# Patient Record
Sex: Male | Born: 1980 | Race: White | Hispanic: Yes | Marital: Married | Smoking: Former smoker
Health system: Southern US, Community
[De-identification: ages and names within clinical notes are randomized; demographics above are authoritative.]

## PROBLEM LIST (undated history)

## (undated) DIAGNOSIS — F419 Anxiety disorder, unspecified: Secondary | ICD-10-CM

## (undated) HISTORY — DX: Anxiety disorder, unspecified: F41.9

---

## 2004-12-08 ENCOUNTER — Emergency Department (HOSPITAL_COMMUNITY): Admission: EM | Admit: 2004-12-08 | Discharge: 2004-12-08 | Payer: Self-pay | Admitting: Family Medicine

## 2016-05-09 ENCOUNTER — Ambulatory Visit (INDEPENDENT_AMBULATORY_CARE_PROVIDER_SITE_OTHER): Payer: Self-pay | Admitting: Physician Assistant

## 2016-05-09 VITALS — BP 123/72 | HR 70 | Resp 16 | Wt 206.0 lb

## 2016-05-09 DIAGNOSIS — N410 Acute prostatitis: Secondary | ICD-10-CM

## 2016-05-09 DIAGNOSIS — R1011 Right upper quadrant pain: Secondary | ICD-10-CM

## 2016-05-09 DIAGNOSIS — R350 Frequency of micturition: Secondary | ICD-10-CM

## 2016-05-09 LAB — POCT CBC
GRANULOCYTE PERCENT: 72 % (ref 37–80)
HEMATOCRIT: 43.1 % — AB (ref 43.5–53.7)
HEMOGLOBIN: 15.3 g/dL (ref 14.1–18.1)
LYMPH, POC: 1.4 (ref 0.6–3.4)
MCH, POC: 31.4 pg — AB (ref 27–31.2)
MCHC: 35.5 g/dL — AB (ref 31.8–35.4)
MCV: 88.4 fL (ref 80–97)
MID (cbc): 0.4 (ref 0–0.9)
MPV: 8.3 fL (ref 0–99.8)
POC GRANULOCYTE: 4.5 (ref 2–6.9)
POC LYMPH PERCENT: 22.4 %L (ref 10–50)
POC MID %: 5.6 %M (ref 0–12)
Platelet Count, POC: 148 10*3/uL (ref 142–424)
RBC: 4.88 M/uL (ref 4.69–6.13)
RDW, POC: 13.4 %
WBC: 6.3 10*3/uL (ref 4.6–10.2)

## 2016-05-09 LAB — POC MICROSCOPIC URINALYSIS (UMFC): Mucus: ABSENT

## 2016-05-09 LAB — POCT URINALYSIS DIP (MANUAL ENTRY)
BILIRUBIN UA: NEGATIVE
Bilirubin, UA: NEGATIVE
Glucose, UA: NEGATIVE
Leukocytes, UA: NEGATIVE
Nitrite, UA: NEGATIVE
PH UA: 5.5
PROTEIN UA: NEGATIVE
Spec Grav, UA: >=1.03
Urobilinogen, UA: 0.2

## 2016-05-09 MED ORDER — CIPROFLOXACIN HCL 500 MG PO TABS: 500.0000 mg | ORAL_TABLET | Freq: Two times a day (BID) | ORAL | 0 refills | Status: AC

## 2016-05-09 NOTE — Progress Notes (Signed)
05/09/2016 at 1:46 PM  David Bell / DOB: May 01, 1981 / MRN: [id number]  The patient  does not have a problem list on file.  SUBJECTIVE  David Bell is a 36 y.o. male who complains of dysuria, hematuria, urinary frequency, urinary hesitancy, and suprapubic abdominal pain x 5  days. He denies flank pain, pelvic pain,rectal pain, penile discharge, and testicular swelling or pain. Has tried cranberry juice with no relief. Most recent UTI was eight months ago but he cannot recall what he was treated with. Has no history of kidney stones. He drinks 0.5 gallon a day. Denies history of smoking or recent upper respiratory illness.  Has history of gallbladder sludge x 1 year ago per RUQ [place].    He  has no past medical history on file.    Medications reviewed and updated by myself where necessary, and exist elsewhere in the encounter.   Mr. David Bell has no allergies on file. He  reports that he has never smoked. He has never used smokeless tobacco. He reports that he drinks alcohol. He reports that he does not use drugs. He  has no sexual activity history on file. The patient  has no past surgical history on file.  His family history is not on file.  Review of Systems  Constitutional: Negative for chills, diaphoresis, fever and malaise/fatigue.  HENT: Negative for congestion and sore throat.   Gastrointestinal: Negative for blood in stool, constipation and diarrhea.  Musculoskeletal: Negative for joint pain and myalgias.    OBJECTIVE  His  weight is 206 lb (93.4 kg). His blood pressure is 123/72 and his pulse is 70. His respiration is 16 and oxygen saturation is 99%.  The patient's body mass index is unknown because there is no height or weight on file.  Physical Exam  Constitutional: He is oriented to person, place, and time. He appears well-developed and well-nourished. No distress.  HENT:  Head: Normocephalic and atraumatic.  Eyes: Conjunctivae are normal.  Neck: Normal range of motion.    Respiratory: Effort normal.  GI: Soft. Normal appearance and bowel sounds are normal. There is tenderness in the right upper quadrant and suprapubic area. There is positive Murphy's sign. There is no CVA tenderness.  Genitourinary: Prostate is not enlarged and not tender.  Genitourinary Comments: Prostate exam did elicit a sudden urge for pt to urinate.  Musculoskeletal: Normal range of motion.  Neurological: He is alert and oriented to person, place, and time.  Skin: Skin is warm and dry.  Psychiatric: He has a normal mood and affect.    Results for orders placed or performed in visit on 05/09/16 (from the past 24 hour(s))  POCT urinalysis dipstick     Status: Abnormal   Collection Time: 05/09/16 11:45 AM  Result Value Ref Range   Color, UA yellow yellow   Clarity, UA clear clear   Glucose, UA negative negative   Bilirubin, UA negative negative   Ketones, POC UA negative negative   Spec Grav, UA >=1.030    Blood, UA trace-lysed (A) negative   pH, UA 5.5    Protein Ur, POC negative negative   Urobilinogen, UA 0.2    Nitrite, UA Negative Negative   Leukocytes, UA Negative Negative  POCT Microscopic Urinalysis ([facility])     Status: Abnormal   Collection Time: 05/09/16 12:03 PM  Result Value Ref Range   WBC,UR,HPF,POC None None WBC/hpf   RBC,UR,HPF,POC None None RBC/hpf   Bacteria None None, Too numerous to count  Mucus Absent Absent   Epithelial Cells, UR Per Microscopy Few (A) None, Too numerous to count cells/hpf  POCT CBC     Status: Abnormal   Collection Time: 05/09/16 12:42 PM  Result Value Ref Range   WBC 6.3 4.6 - 10.2 K/uL   Lymph, poc 1.4 0.6 - 3.4   POC LYMPH PERCENT 22.4 10 - 50 %L   MID (cbc) 0.4 0 - 0.9   POC MID % 5.6 0 - 12 %M   POC Granulocyte 4.5 2 - 6.9   Granulocyte percent 72.0 37 - 80 %G   RBC 4.88 4.69 - 6.13 M/uL   Hemoglobin 15.3 14.1 - 18.1 g/dL   HCT, POC 43.1 (A) 43.5 - 53.7 %   MCV 88.4 80 - 97 fL   MCH, POC 31.4 (A) 27 - 31.2 pg   MCHC 35.5  (A) 31.8 - 35.4 g/dL   RDW, POC 13.4 %   Platelet Count, POC 148 142 - 424 K/uL   MPV 8.3 0 - 99.8 fL   ASSESSMENT & PLAN  This case was precepted with Dr. Linna Darner.   Elier was seen today for flank pain, hematuria and dysuria.  Diagnoses and all orders for this visit:  Urinary frequency - Await results      -  POCT urinalysis dipstick -     POCT Microscopic Urinalysis ([facility]) -     POCT CBC -     CMP14+EGFR -     PSA -     GC/Chlamydia Probe Amp -     Urine culture  Prostatitis, acute -     ciprofloxacin (CIPRO) 500 MG tablet; Take 1 tablet (500 mg total) by mouth 2 (two) times daily. - Pt instructed to follow up if no improvement in 2 weeks.   RUQ pain -     US Abdomen Limited RUQ; Future   The patient was advised to call or come back to clinic if he does not see an improvement in symptoms, or worsens with the above plan.   Tenna Delaine, PA-C [facility] 05/09/2016 1:46 PM

## 2016-05-09 NOTE — Patient Instructions (Addendum)
We will treat this as prostatitis. Take the medication twice daily for 14 days. If symptoms continue, please return to clinic. If anything worsens, seek care sooner. We will contact you with your other lab results. Thank you for letting me participate in your health and well being.   Prostatitis Prostatitis is swelling of the prostate gland. The prostate helps to make semen. It is below a man's bladder, in front of the rectum. There are different types of prostatitis. Follow these instructions at home:  Take over-the-counter and prescription medicines only as told by your doctor.  If you were prescribed an antibiotic medicine, take it as told by your doctor. Do not stop taking the antibiotic even if you start to feel better.  If your doctor prescribed exercises, do them as directed.  Take sitz baths as told by your doctor. To take a sitz bath, sit in warm water that is deep enough to cover your hips and butt.  Keep all follow-up visits as told by your doctor. This is important. Contact a doctor if:  Your symptoms get worse.  You have a fever. Get help right away if:  You have chills.  You feel sick to your stomach (nauseous).  You throw up (vomit).  You feel light-headed.  You feel like you might pass out (faint).  You cannot pee (urinate).  You have blood or clumps of blood (blood clots) in your pee (urine). This information is not intended to replace advice given to you by your health care provider. Make sure you discuss any questions you have with your health care provider. Document Released: 10/24/2011 Document Revised: 01/13/2016 Document Reviewed: 01/13/2016 Elsevier Interactive Patient Education  2017 [employer]

## 2016-05-10 LAB — CMP14+EGFR
ALK PHOS: 88 IU/L (ref 39–117)
ALT: 35 IU/L (ref 0–44)
AST: 19 IU/L (ref 0–40)
Albumin/Globulin Ratio: 1.7 (ref 1.2–2.2)
Albumin: 4.3 g/dL (ref 3.5–5.5)
BILIRUBIN TOTAL: 0.3 mg/dL (ref 0.0–1.2)
BUN/Creatinine Ratio: 19 (ref 9–20)
BUN: 20 mg/dL (ref 6–20)
CO2: 26 mmol/L (ref 18–29)
Calcium: 9.3 mg/dL (ref 8.7–10.2)
Chloride: 99 mmol/L (ref 96–106)
Creatinine, Ser: 1.03 mg/dL (ref 0.76–1.27)
GFR calc non Af Amer: 93 mL/min/{1.73_m2} (ref >59)
GFR, EST AFRICAN AMERICAN: 108 mL/min/{1.73_m2} (ref >59)
GLUCOSE: 81 mg/dL (ref 65–99)
Globulin, Total: 2.6 g/dL (ref 1.5–4.5)
POTASSIUM: 4.4 mmol/L (ref 3.5–5.2)
Sodium: 140 mmol/L (ref 134–144)
TOTAL PROTEIN: 6.9 g/dL (ref 6.0–8.5)

## 2016-05-10 LAB — PSA: PROSTATE SPECIFIC AG, SERUM: 0.9 ng/mL (ref 0.0–4.0)

## 2016-05-11 LAB — GC/CHLAMYDIA PROBE AMP
CHLAMYDIA, DNA PROBE: NEGATIVE
Neisseria gonorrhoeae by PCR: NEGATIVE

## 2016-05-11 LAB — URINE CULTURE: ORGANISM ID, BACTERIA: NO GROWTH

## 2016-05-15 ENCOUNTER — Ambulatory Visit
Admission: RE | Admit: 2016-05-15 | Discharge: 2016-05-15 | Disposition: A | Discharge status: Home / Self Care | Payer: No Typology Code available for payment source | Source: Ambulatory Visit | Attending: Physician Assistant | Admitting: Physician Assistant

## 2016-05-15 DIAGNOSIS — R1011 Right upper quadrant pain: Secondary | ICD-10-CM

## 2018-10-10 ENCOUNTER — Encounter: Payer: Self-pay | Admitting: Medical

## 2018-10-10 ENCOUNTER — Telehealth: Payer: Self-pay | Admitting: Medical

## 2018-10-10 ENCOUNTER — Ambulatory Visit (INDEPENDENT_AMBULATORY_CARE_PROVIDER_SITE_OTHER): Payer: BC Managed Care – PPO | Admitting: Medical

## 2018-10-10 ENCOUNTER — Other Ambulatory Visit: Payer: Self-pay

## 2018-10-10 VITALS — BP 130/79 | HR 57 | Temp 98.3°F | Resp 16 | Ht 70.0 in | Wt 220.6 lb

## 2018-10-10 DIAGNOSIS — Z0001 Encounter for general adult medical examination with abnormal findings: Secondary | ICD-10-CM

## 2018-10-10 DIAGNOSIS — R000 Tachycardia, unspecified: Secondary | ICD-10-CM | POA: Diagnosis not present

## 2018-10-10 DIAGNOSIS — Z0000 Encounter for general adult medical examination without abnormal findings: Secondary | ICD-10-CM

## 2018-10-10 DIAGNOSIS — F419 Anxiety disorder, unspecified: Secondary | ICD-10-CM | POA: Diagnosis not present

## 2018-10-10 LAB — COMPREHENSIVE METABOLIC PANEL
ALT: 31 U/L (ref 0–53)
AST: 24 U/L (ref 0–37)
Albumin: 4.3 g/dL (ref 3.5–5.2)
Alkaline Phosphatase: 65 U/L (ref 39–117)
BUN: 17 mg/dL (ref 6–23)
CO2: 28 mEq/L (ref 19–32)
Calcium: 9.2 mg/dL (ref 8.4–10.5)
Chloride: 103 mEq/L (ref 96–112)
Creatinine, Ser: 0.84 mg/dL (ref 0.40–1.50)
GFR: 101.84 mL/min (ref >60.00)
Glucose, Bld: 88 mg/dL (ref 70–99)
Potassium: 4.4 mEq/L (ref 3.5–5.1)
Sodium: 138 mEq/L (ref 135–145)
Total Bilirubin: 0.7 mg/dL (ref 0.2–1.2)
Total Protein: 6.7 g/dL (ref 6.0–8.3)

## 2018-10-10 LAB — CBC WITH DIFFERENTIAL/PLATELET
Basophils Absolute: 0 10*3/uL (ref 0.0–0.1)
Basophils Relative: 0.8 % (ref 0.0–3.0)
Eosinophils Absolute: 0.2 10*3/uL (ref 0.0–0.7)
Eosinophils Relative: 4.7 % (ref 0.0–5.0)
HCT: 44.6 % (ref 39.0–52.0)
Hemoglobin: 15.1 g/dL (ref 13.0–17.0)
Lymphocytes Relative: 28.2 % (ref 12.0–46.0)
Lymphs Abs: 1.2 10*3/uL (ref 0.7–4.0)
MCHC: 33.8 g/dL (ref 30.0–36.0)
MCV: 89.5 fl (ref 78.0–100.0)
Monocytes Absolute: 0.5 10*3/uL (ref 0.1–1.0)
Monocytes Relative: 11.5 % (ref 3.0–12.0)
Neutro Abs: 2.4 10*3/uL (ref 1.4–7.7)
Neutrophils Relative %: 54.8 % (ref 43.0–77.0)
Platelets: 140 10*3/uL — ABNORMAL LOW (ref 150.0–400.0)
RBC: 4.98 Mil/uL (ref 4.22–5.81)
RDW: 14.2 % (ref 11.5–15.5)
WBC: 4.4 10*3/uL (ref 4.0–10.5)

## 2018-10-10 LAB — LIPID PANEL
Cholesterol: 175 mg/dL (ref 0–200)
HDL: 68.3 mg/dL (ref >39.00)
LDL Cholesterol: 96 mg/dL (ref 0–99)
NonHDL: 106.26
Total CHOL/HDL Ratio: 3
Triglycerides: 49 mg/dL (ref 0.0–149.0)
VLDL: 9.8 mg/dL (ref 0.0–40.0)

## 2018-10-10 MED ORDER — VENLAFAXINE HCL ER 37.5 MG PO CP24: 37.5000 mg | ORAL_CAPSULE | Freq: Every day | ORAL | 0 refills | Status: DC

## 2018-10-10 NOTE — Telephone Encounter (Signed)
Pt schedule for 10-10-2018. Done  Copied from CRM [id number]. Topic: Appointment Scheduling - Prior Auth Required for Appointment >> Oct 07, 2018  8:55 AM Louie Bun, Rosey Bath D wrote: No appointment has been scheduled. Patient is requesting NP appointment with Esperanza Richters. Per scheduling protocol, this appointment requires a prior authorization prior to scheduling.  Route to department's PEC pool.

## 2018-10-10 NOTE — Progress Notes (Signed)
Subjective:    Patient ID: David Bell, male    DOB: 01/04/1981, 38 y.o.   MRN: [id number]  HPI   Pt in for first time. Pt wife pt of Dr. Drue NovelPaz.   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 Pt states used to exercise regularly now too busy with work. Enjoyed riding bike in past about 20 miles a day. Occasional smoker one cigarette every 2 months. Alcohol 1-2 12 packs a week(on weekends) Married- no children.  Pt states he has been going through a lot of stress related to work. Agitated this weekend. Pt states he has been feeling anxious for months. He states he has big project at work. Example project now is causing stress. No panic attacks described. When anxious this weekend he felt heart beat fast briefly. But no palpitation or chest pain. He states when woke up in morning is felt heart beat fast. Night before he drank 6 beers.   Review of Systems  Constitutional: Negative for chills, diaphoresis and fatigue.  Respiratory: Negative for cough, chest tightness and wheezing.   Cardiovascular: Negative for chest pain and palpitations.       Brief tachycardia this weekend.  Gastrointestinal: Negative for abdominal distention, blood in stool, diarrhea, nausea and rectal pain.  Musculoskeletal: Negative for back pain and myalgias.  Neurological: Negative for dizziness, speech difficulty, weakness, light-headedness and headaches.  Hematological: Negative for adenopathy. Does not bruise/bleed easily.  Psychiatric/Behavioral: Negative for behavioral problems.    No past medical history on file.   Social History   Socioeconomic History  . Marital status: Married    Spouse name: Not on file  . Number of children: Not on file  . Years of education: Not on file  . Highest education level: Not on file  Occupational History  . Not on file  Social Needs  . Financial resource strain: Not on file  . Food insecurity:    Worry: Not on file     Inability: Not on file  . Transportation needs:    Medical: Not on file    Non-medical: Not on file  Tobacco Use  . Smoking status: Light Tobacco Smoker  . Smokeless tobacco: Never Used  Substance and Sexual Activity  . Alcohol use: Yes  . Drug use: Never  . Sexual activity: Not on file  Lifestyle  . Physical activity:    Days per week: Not on file    Minutes per session: Not on file  . Stress: Not on file  Relationships  . Social connections:    Talks on phone: Not on file    Gets together: Not on file    Attends religious service: Not on file    Active member of club or organization: Not on file    Attends meetings of clubs or organizations: Not on file    Relationship status: Not on file  . Intimate partner violence:    Fear of current or ex partner: Not on file    Emotionally abused: Not on file    Physically abused: Not on file    Forced sexual activity: Not on file  Other Topics Concern  . Not on file  Social History Narrative  . Not on file     No family history on file.  Not on File  No current outpatient medications on file prior to visit.   No current facility-administered medications on file prior to visit.     BP  130/79   Pulse (!) 57   Temp 98.3 F (36.8 C) (Oral)   Resp 16   Ht 5\' 10"  (1.778 m)   Wt 220 lb 9.6 oz (100.1 kg)   SpO2 99%   BMI 31.65 kg/m       Objective:   Physical Exam   General Mental Status- Alert. General Appearance- Not in acute distress.   Skin General: Color- Normal Color. Moisture- Normal Moisture. No worrisome mole or lesions.  Neck Carotid Arteries- Normal color. Moisture- Normal Moisture. No carotid bruits. No JVD.  Chest and Lung Exam Auscultation: Breath Sounds:-Normal.  Cardiovascular Auscultation:Rythm- Regular. Murmurs & Other Heart Sounds:Auscultation of the heart reveals- No Murmurs.  Abdomen Inspection:-Inspeection Normal. Palpation/Percussion:Note:No mass. Palpation and Percussion of the  abdomen reveal- Non Tender, Non Distended + BS, no rebound or guarding.   Neurologic Cranial Nerve exam:- CN III-XII intact(No nystagmus), symmetric smile. Strength:- 5/5 equal and symmetric strength both upper and lower extremities.     Assessment & Plan:  For you wellness exam today I have ordered cbc, cmp, lipid panel, and hiv.  ekg done today for brief tachycardia.  Vaccine given today tdap.  Recommend exercise and healthy diet.  We will let you know lab results as they come in.  For anxiety will rx low dose effexor and see if this helps with anxiety. In 2 weeks can increase meds.  Your ekg today appears normal. Mild slow rate. I think normal. Subjective tachycardia may have been dehydration related to alcohol and caffeine use.  Do recommend cutting back on alcohol use. And spread out. Example 1-2 beers 3 times.   Esperanza Richters, PA-C

## 2018-10-10 NOTE — Patient Instructions (Addendum)
For you wellness exam today I have ordered cbc, cmp, lipid panel, and hiv.  ekg done today for brief tachycardia.  Vaccine  Up to date date States tdap 2015.  Recommend exercise and healthy diet.  We will let you know lab results as they come in.  For anxiety will rx low dose effexor and see if this helps with anxiety. In 2 weeks can increase meds.  Your ekg today appears normal. Mild slow rate. I think normal. Subjective tachycardia may have been dehydation related to alcohol and caffeine use.  Do recommend cutting back on alcohol use. And spread out. Example 1-2 beers 3 times.   Preventive Care 18-39 Years, Male Preventive care refers to lifestyle choices and visits with your health care provider that can promote health and wellness. What does preventive care include?   A yearly physical exam. This is also called an annual well check.  Dental exams once or twice a year.  Routine eye exams. Ask your health care provider how often you should have your eyes checked.  Personal lifestyle choices, including: ? Daily care of your teeth and gums. ? Regular physical activity. ? Eating a healthy diet. ? Avoiding tobacco and drug use. ? Limiting alcohol use. ? Practicing safe sex. What happens during an annual well check? The services and screenings done by your health care provider during your annual well check will depend on your age, overall health, lifestyle risk factors, and family history of disease. Counseling Your health care provider may ask you questions about your:  Alcohol use.  Tobacco use.  Drug use.  Emotional well-being.  Home and relationship well-being.  Sexual activity.  Eating habits.  Work and work Statistician. Screening You may have the following tests or measurements:  Height, weight, and BMI.  Blood pressure.  Lipid and cholesterol levels. These may be checked every 5 years starting at age 79.  Diabetes screening. This is done by checking  your blood sugar (glucose) after you have not eaten for a while (fasting).  Skin check.  Hepatitis C blood test.  Hepatitis B blood test.  Sexually transmitted disease (STD) testing. Discuss your test results, treatment options, and if necessary, the need for more tests with your health care provider. Vaccines Your health care provider may recommend certain vaccines, such as:  Influenza vaccine. This is recommended every year.  Tetanus, diphtheria, and acellular pertussis (Tdap, Td) vaccine. You may need a Td booster every 10 years.  Varicella vaccine. You may need this if you have not been vaccinated.  HPV vaccine. If you are 17 or younger, you may need three doses over 6 months.  Measles, mumps, and rubella (MMR) vaccine. You may need at least one dose of MMR.You may also need a second dose.  Pneumococcal 13-valent conjugate (PCV13) vaccine. You may need this if you have certain conditions and have not been vaccinated.  Pneumococcal polysaccharide (PPSV23) vaccine. You may need one or two doses if you smoke cigarettes or if you have certain conditions.  Meningococcal vaccine. One dose is recommended if you are age 47-21 years and a first-year college student living in a residence hall, or if you have one of several medical conditions. You may also need additional booster doses.  Hepatitis A vaccine. You may need this if you have certain conditions or if you travel or work in places where you may be exposed to hepatitis A.  Hepatitis B vaccine. You may need this if you have certain conditions or if you  travel or work in places where you may be exposed to hepatitis B.  Haemophilus influenzae type b (Hib) vaccine. You may need this if you have certain risk factors. Talk to your health care provider about which screenings and vaccines you need and how often you need them. This information is not intended to replace advice given to you by your health care provider. Make sure you  discuss any questions you have with your health care provider. Document Released: 06/20/2001 Document Revised: 12/05/2016 Document Reviewed: 02/23/2015 Elsevier Interactive Patient Education  2019 [employer].

## 2018-10-24 NOTE — Progress Notes (Signed)
Subjective:    Patient ID: David Bell, male    DOB: 1980/06/23, 38 y.o.   MRN: [id number]  HPI  Virtual Visit via Telephone Note  I connected with David Bell on 10/25/18 at  9:40 AM EDT by telephone and verified that I am speaking with the correct person using two identifiers.  Location: Patient: home Provider: office  Pt did not check his bp or pulse.   I discussed the limitations, risks, security and privacy concerns of performing an evaluation and management service by telephone and the availability of in person appointments. I also discussed with the patient that there may be a patient responsible charge related to this service. The patient expressed understanding and agreed to proceed.   History of Present Illness:  Follow up for anxiety.  Last visit he reported a lot of stress related to work. Agitated on weekend. Pt states he has been feeling anxious for months. He states he has big project at work. Example project now is causing stress. No panic attacks described. When anxious this weekend he felt heart beat fast briefly. But no palpitation or chest pain. He states when woke up in morning is felt heart beat fast. Night before he drank 6 beers.  Had wanted him to follow up 2 weeks after starting effexor.  Also wanted to follow up to see if he felt any potential tachycardia.  Also asked him to cut back on alcohol use.  Pt states he feels a lot better now/feels great. He has less anxiety.No tachycardia or palpitations. Has cut back on alcohol signfiicantly and exercising more. Also less work stress.  No side effects with medication.     Observations/Objective:  General- alert, oriented pleasant speaking in normal manner.   Assessment and Plan: Anxiety symptoms resolved/controlled with effexor. No side effects and working. Rx refills sent to pharmacy.  No further tachycardia  Or palpitation.  Describes drinking much less and feels better.  Provided  everything goes well advise follow up in 5 months or as needed  Esperanza RichtersEdward Maleigh Bagot, PA-C  Follow Up Instructions:    I discussed the assessment and treatment plan with the patient. The patient was provided an opportunity to ask questions and all were answered. The patient agreed with the plan and demonstrated an understanding of the instructions.   The patient was advised to call back or seek an in-person evaluation if the symptoms worsen or if the condition fails to improve as anticipated.  I provided 15 minutes of non-face-to-face time during this encounter.   Esperanza RichtersEdward Kathyleen Radice, PA-C    Review of Systems  Constitutional: Negative for chills, fatigue and fever.  Respiratory: Negative for cough, chest tightness, shortness of breath and wheezing.   Cardiovascular: Negative for chest pain and palpitations.  Gastrointestinal: Negative for abdominal pain, constipation, nausea and vomiting.  Skin: Negative for rash.  Neurological: Negative for dizziness, seizures, weakness and headaches.  Hematological: Negative for adenopathy. Does not bruise/bleed easily.  Psychiatric/Behavioral: Negative for behavioral problems, decreased concentration, dysphoric mood, sleep disturbance and suicidal ideas. The patient is nervous/anxious.     Past Medical History:  Diagnosis Date  . Anxiety      Social History   Socioeconomic History  . Marital status: Married    Spouse name: Not on file  . Number of children: Not on file  . Years of education: Not on file  . Highest education level: Not on file  Occupational History  . Not on file  Social Needs  . Financial  resource strain: Not on file  . Food insecurity    Worry: Not on file    Inability: Not on file  . Transportation needs    Medical: Not on file    Non-medical: Not on file  Tobacco Use  . Smoking status: Light Tobacco Smoker    Types: Cigarettes  . Smokeless tobacco: Never Used  Substance and Sexual Activity  . Alcohol use: Yes     Comment: 1-2 case of 12 per weekend.  . Drug use: Never  . Sexual activity: Yes  Lifestyle  . Physical activity    Days per week: Not on file    Minutes per session: Not on file  . Stress: Not on file  Relationships  . Social Herbalist on phone: Not on file    Gets together: Not on file    Attends religious service: Not on file    Active member of club or organization: Not on file    Attends meetings of clubs or organizations: Not on file    Relationship status: Not on file  . Intimate partner violence    Fear of current or ex partner: Not on file    Emotionally abused: Not on file    Physically abused: Not on file    Forced sexual activity: Not on file  Other Topics Concern  . Not on file  Social History Narrative  . Not on file     No family history on file.  Not on File  No current outpatient medications on file prior to visit.   No current facility-administered medications on file prior to visit.     There were no vitals taken for this visit.      Objective:   Physical Exam        Assessment & Plan:

## 2018-10-25 ENCOUNTER — Encounter: Payer: Self-pay | Admitting: Medical

## 2018-10-25 ENCOUNTER — Other Ambulatory Visit: Payer: Self-pay

## 2018-10-25 ENCOUNTER — Ambulatory Visit (INDEPENDENT_AMBULATORY_CARE_PROVIDER_SITE_OTHER): Payer: BC Managed Care – PPO | Admitting: Medical

## 2018-10-25 DIAGNOSIS — F419 Anxiety disorder, unspecified: Secondary | ICD-10-CM

## 2018-10-25 MED ORDER — VENLAFAXINE HCL ER 37.5 MG PO CP24: 37.5000 mg | ORAL_CAPSULE | Freq: Every day | ORAL | 4 refills | Status: AC

## 2018-10-25 NOTE — Patient Instructions (Signed)
Anxiety symptoms resolved/controlled with effexor. No side effects and working. Rx refills sent to pharmacy.  No further tachycardia  Or palpitation.  Describes drinking much less and feels better.  Provided everything goes well advise follow up in 5 months or as needed

## 2019-02-13 ENCOUNTER — Other Ambulatory Visit: Payer: Self-pay

## 2019-02-14 ENCOUNTER — Other Ambulatory Visit: Payer: Self-pay

## 2019-02-14 ENCOUNTER — Ambulatory Visit (HOSPITAL_BASED_OUTPATIENT_CLINIC_OR_DEPARTMENT_OTHER)
Admission: RE | Admit: 2019-02-14 | Discharge: 2019-02-14 | Disposition: A | Discharge status: Home / Self Care | Payer: BC Managed Care – PPO | Source: Ambulatory Visit | Attending: Medical | Admitting: Medical

## 2019-02-14 ENCOUNTER — Ambulatory Visit (INDEPENDENT_AMBULATORY_CARE_PROVIDER_SITE_OTHER): Payer: BC Managed Care – PPO | Admitting: Medical

## 2019-02-14 ENCOUNTER — Encounter: Payer: Self-pay | Admitting: Medical

## 2019-02-14 VITALS — BP 128/78 | HR 59 | Temp 97.8°F | Resp 16 | Ht 70.0 in | Wt 218.6 lb

## 2019-02-14 DIAGNOSIS — R10A0 Flank pain, unspecified side: Secondary | ICD-10-CM

## 2019-02-14 DIAGNOSIS — R319 Hematuria, unspecified: Secondary | ICD-10-CM

## 2019-02-14 DIAGNOSIS — R109 Unspecified abdominal pain: Secondary | ICD-10-CM

## 2019-02-14 LAB — CBC WITH DIFFERENTIAL/PLATELET
Basophils Absolute: 0 10*3/uL (ref 0.0–0.1)
Basophils Relative: 0.5 % (ref 0.0–3.0)
Eosinophils Absolute: 0.2 10*3/uL (ref 0.0–0.7)
Eosinophils Relative: 3.3 % (ref 0.0–5.0)
HCT: 46.7 % (ref 39.0–52.0)
Hemoglobin: 15.6 g/dL (ref 13.0–17.0)
Lymphocytes Relative: 25 % (ref 12.0–46.0)
Lymphs Abs: 1.5 10*3/uL (ref 0.7–4.0)
MCHC: 33.5 g/dL (ref 30.0–36.0)
MCV: 90.4 fl (ref 78.0–100.0)
Monocytes Absolute: 0.6 10*3/uL (ref 0.1–1.0)
Monocytes Relative: 9.4 % (ref 3.0–12.0)
Neutro Abs: 3.7 10*3/uL (ref 1.4–7.7)
Neutrophils Relative %: 61.8 % (ref 43.0–77.0)
Platelets: 149 10*3/uL — ABNORMAL LOW (ref 150.0–400.0)
RBC: 5.16 Mil/uL (ref 4.22–5.81)
RDW: 13.7 % (ref 11.5–15.5)
WBC: 6 10*3/uL (ref 4.0–10.5)

## 2019-02-14 LAB — COMPREHENSIVE METABOLIC PANEL
ALT: 32 U/L (ref 0–53)
AST: 24 U/L (ref 0–37)
Albumin: 4.7 g/dL (ref 3.5–5.2)
Alkaline Phosphatase: 72 U/L (ref 39–117)
BUN: 19 mg/dL (ref 6–23)
CO2: 29 mEq/L (ref 19–32)
Calcium: 9.6 mg/dL (ref 8.4–10.5)
Chloride: 101 mEq/L (ref 96–112)
Creatinine, Ser: 0.93 mg/dL (ref 0.40–1.50)
GFR: 90.39 mL/min (ref >60.00)
Glucose, Bld: 93 mg/dL (ref 70–99)
Potassium: 4.2 mEq/L (ref 3.5–5.1)
Sodium: 137 mEq/L (ref 135–145)
Total Bilirubin: 0.6 mg/dL (ref 0.2–1.2)
Total Protein: 7.2 g/dL (ref 6.0–8.3)

## 2019-02-14 LAB — LIPASE: Lipase: 24 U/L (ref 11.0–59.0)

## 2019-02-14 MED ORDER — HYDROCODONE-ACETAMINOPHEN 5-325 MG PO TABS: 1.0000 | ORAL_TABLET | Freq: Four times a day (QID) | ORAL | 0 refills | Status: AC | PRN

## 2019-02-14 MED ORDER — KETOROLAC TROMETHAMINE 60 MG/2ML IM SOLN
60.0000 mg | Freq: Once | INTRAMUSCULAR | Status: AC
  Administered 2019-02-14: 60 mg via INTRAMUSCULAR

## 2019-02-14 MED ORDER — CIPROFLOXACIN HCL 500 MG PO TABS: 500.0000 mg | ORAL_TABLET | Freq: Two times a day (BID) | ORAL | 0 refills | Status: AC

## 2019-02-14 NOTE — Progress Notes (Signed)
Subjective:    Patient ID: David Bell, male    DOB: 1980-05-12, 38 y.o.   MRN: [id number]  HPI  Patient here for hematuria.  He reports Sunday morning he noticed blood in his urine with mild pain to right lower back/flank. After 1-2 voids, pain and blood resolved. Monday and Tuesday: normal urination, no pain. Woke up Wednesday with another episode of hematuria and pain, but bleeding resolved after first void. Pain was continuous yesterday, but no blood. First void this morning was bloody. Reports stream stopped mid-void patient unsure if it was a stone or clot, but once it passed he reports significant amount of blood that "went everywhere and I couldn't control it." Pain was bad enough to keep him awake last night. Current pain level is 7/10, worse in right flank region. Denies fevers, chills.   Borderline line low platelets (140,000) at physical in June. Denies taking any recent NSAIDs or aspirin. No significant changes to diet.   Pt has hx of sludge in gallbadder but no kidney stones. He still has his gallbladder. Pt states eating does not worsen the pain.     Review of Systems  Constitutional: Negative for chills, fatigue and fever.  Respiratory: Negative for chest tightness and shortness of breath.   Cardiovascular: Negative for chest pain and palpitations.  Gastrointestinal: Positive for abdominal pain. Negative for abdominal distention, blood in stool, constipation and diarrhea.       No black or blood stools. Some pain abdomen level of umbilicus on rt side.  Genitourinary: Positive for flank pain, frequency, hematuria and urgency. Negative for difficulty urinating and dysuria.  Musculoskeletal: Positive for back pain.       Right lower back pain intermittent since Sunday, now worsening over the past couple days with pain radiating to flank and groin. Pain currently 7/10.  Neurological: Negative for dizziness, light-headedness and headaches.  Hematological: Negative for  adenopathy. Does not bruise/bleed easily.       Objective:   Physical Exam   General Mental Status- Alert. General Appearance- Not in acute distress.   Skin General: Color- Normal Color. Moisture- Normal Moisture.  Neck Carotid Arteries- Normal color. Moisture- Normal Moisture. No carotid bruits. No JVD.  Chest and Lung Exam Auscultation: Breath Sounds:-Normal.  Cardiovascular Auscultation:Rythm- Regular. Murmurs & Other Heart Sounds:Auscultation of the heart reveals- No Murmurs.  Abdomen Inspection:-Inspeection Normal. Palpation/Percussion:Note:No mass. Palpation and Percussion of the abdomen reveal-  Tenderness rt side abdomen level of umblicus, no rlq pain at all. No direct ruq tenderness to palpation., Non Distended + BS, no rebound or guarding.  Back- no cva pain. But rt side flank pain level of kidney/mid axillary region.     Neurologic Cranial Nerve exam:- CN III-XII intact(No nystagmus), symmetric smile. Strength:- 5/5 equal and symmetric strength both upper and lower extremities.     Assessment & Plan:  With your severe rt flank pain with gross hematuria, I do think kidney stone most likely diagnosis. You mentioned hx of sludge in gallbaldder but no pain direct ruq on exam and no pain after eating. Not also no rlq pain(over appendix).  Will get cbc, cmp, amylase stat.  Will order ct renal stone protocol.  Toradol 60 mg im.  For severe pain norco.  Rx cipro as we approach weekend. In event stone obstucting flow/infection.  If signs/symptoms worsen over the weekend then ED evaluation.  Follow up in Monday or as needed. Virtual ok if pain significantly. decreased.   40 minutes spent with pt. 50%  of time spent counseling on plan going forward.  NP Caleen Jobs saw pt initially. Then I interviewed. Modified note accordingly and I mad tx decisions.

## 2019-02-14 NOTE — Patient Instructions (Addendum)
With your severe rt flank pain with gross hematuria, I do think kidney stone most likely diagnosis. You mentioned hx of sludge in gallbaldder but no pain direct ruq on exam and no pain after eating. Not also no rlq pain(over appendix).  Will get cbc, cmp, amylase stat.  Will order ct renal stone protocol.  Toradol 60 mg im.  For severe pain norco.  Rx cipro as we approach weekend. In event stone obstucting flow/infection.  If signs/symptoms worsen over the weekend then ED evaluation.  Follow up in Monday or as needed(in office if pain persist). Virtual ok if pain significantly. decreased.

## 2019-02-15 LAB — URINE CULTURE
MICRO NUMBER:: 973750
Result:: NO GROWTH
SPECIMEN QUALITY:: ADEQUATE

## 2019-02-17 ENCOUNTER — Ambulatory Visit: Payer: BC Managed Care – PPO | Admitting: Medical

## 2019-02-17 ENCOUNTER — Other Ambulatory Visit: Payer: Self-pay

## 2019-02-17 ENCOUNTER — Encounter: Payer: Self-pay | Admitting: Medical

## 2019-02-17 VITALS — BP 134/89 | HR 67 | Temp 98.0°F | Resp 16 | Ht 70.0 in | Wt 221.4 lb

## 2019-02-17 DIAGNOSIS — R109 Unspecified abdominal pain: Secondary | ICD-10-CM | POA: Diagnosis not present

## 2019-02-17 DIAGNOSIS — R319 Hematuria, unspecified: Secondary | ICD-10-CM | POA: Diagnosis not present

## 2019-02-17 LAB — POC URINALSYSI DIPSTICK (AUTOMATED)
Bilirubin, UA: NEGATIVE
Blood, UA: NEGATIVE
Glucose, UA: NEGATIVE
Ketones, UA: NEGATIVE
Leukocytes, UA: NEGATIVE
Nitrite, UA: NEGATIVE
Protein, UA: POSITIVE — AB
Spec Grav, UA: 1.015 (ref 1.010–1.025)
Urobilinogen, UA: NEGATIVE E.U./dL — AB
pH, UA: 8 (ref 5.0–8.0)

## 2019-02-17 NOTE — Progress Notes (Signed)
Subjective:    Patient ID: David Bell, male    DOB: [birth year], 38 y.o.   MRN: [id number]  HPI  Pt in for follow up on flank pain and gross hematuria.No recurrent gross hematuria.   Pt location of pain still rt side flank pain. Level of discomfort was 3/10 this weekend. Before I saw him on last visit had episodes of pain greater than 10.  Did ct renal stone protocol, cmp, and cbc.  FINDINGS: Lower chest: No acute abnormality.  Hepatobiliary: No solid liver abnormality is seen. No gallstones, gallbladder wall thickening, or biliary dilatation.  Pancreas: Unremarkable. No pancreatic ductal dilatation or surrounding inflammatory changes.  Spleen: Normal in size without significant abnormality.  Adrenals/Urinary Tract: Adrenal glands are unremarkable. There are very faint medullary or pyramidal calcifications of the kidneys bilaterally without discrete calculus identified. No hydronephrosis. Bladder is unremarkable.  Stomach/Bowel: Stomach is within normal limits. Appendix appears normal. No evidence of bowel wall thickening, distention, or inflammatory changes. Occasional sigmoid diverticula without evidence of acute diverticulitis.  Vascular/Lymphatic: No significant vascular findings are present. No enlarged abdominal or pelvic lymph nodes.  Reproductive: No mass or other significant abnormality.  Other: No abdominal wall hernia or abnormality. No abdominopelvic ascites.  Musculoskeletal: No acute or significant osseous findings.  IMPRESSION: There are very faint medullary or pyramidal calcifications of the kidneys bilaterally without discrete calculus identified to explain flank pain or hematuria. No hydronephrosis.  Pt described before he saw me that he had bright red blood in his urine. Still has pain.  Actually explains not taking pain medication and still decrease to 3/10.    Review of Systems  Constitutional: Negative for chills, fatigue and  fever.  Respiratory: Negative for cough, chest tightness and wheezing.   Cardiovascular: Negative for chest pain and palpitations.  Gastrointestinal: Negative for abdominal distention, abdominal pain, diarrhea, nausea and vomiting.       No pain after he eats.  Genitourinary: Negative for difficulty urinating, enuresis, testicular pain and urgency.  Musculoskeletal: Negative for back pain.  Hematological: Negative for adenopathy. Does not bruise/bleed easily.  Psychiatric/Behavioral: Negative for behavioral problems and confusion.    Past Medical History:  Diagnosis Date  . Anxiety      Social History   Socioeconomic History  . Marital status: Married    Spouse name: Not on file  . Number of children: Not on file  . Years of education: Not on file  . Highest education level: Not on file  Occupational History  . Not on file  Social Needs  . Financial resource strain: Not on file  . Food insecurity    Worry: Not on file    Inability: Not on file  . Transportation needs    Medical: Not on file    Non-medical: Not on file  Tobacco Use  . Smoking status: Light Tobacco Smoker    Types: Cigarettes  . Smokeless tobacco: Never Used  Substance and Sexual Activity  . Alcohol use: Yes    Comment: 1-2 case of 12 per weekend.  . Drug use: Never  . Sexual activity: Yes  Lifestyle  . Physical activity    Days per week: Not on file    Minutes per session: Not on file  . Stress: Not on file  Relationships  . Social Herbalist on phone: Not on file    Gets together: Not on file    Attends religious service: Not on file    Active member of  club or organization: Not on file    Attends meetings of clubs or organizations: Not on file    Relationship status: Not on file  . Intimate partner violence    Fear of current or ex partner: Not on file    Emotionally abused: Not on file    Physically abused: Not on file    Forced sexual activity: Not on file  Other Topics  Concern  . Not on file  Social History Narrative  . Not on file    No past surgical history on file.  No family history on file.  Not on File  Current Outpatient Medications on File Prior to Visit  Medication Sig Dispense Refill  . ciprofloxacin (CIPRO) 500 MG tablet Take 1 tablet (500 mg total) by mouth 2 (two) times daily. 14 tablet 0  . HYDROcodone-acetaminophen (NORCO) 5-325 MG tablet Take 1 tablet by mouth every 6 (six) hours as needed for moderate pain. 16 tablet 0  . venlafaxine XR (EFFEXOR-XR) 37.5 MG 24 hr capsule Take 1 capsule (37.5 mg total) by mouth daily with breakfast. 30 capsule 4   No current facility-administered medications on file prior to visit.     BP 134/89   Pulse 67   Temp 98 F (36.7 C) (Temporal)   Resp 16   Ht 5\' 10"  (1.778 m)   Wt 221 lb 6.4 oz (100.4 kg)   SpO2 100%   BMI 31.77 kg/m       Objective:   Physical Exam  General- No acute distress. Pleasant patient. Neck- Full range of motion, no jvd Lungs- Clear, even and unlabored. Heart- regular rate and rhythm. Neurologic- CNII- XII grossly intact. Abdomen- soft, nt, nd, +bs. No rebound or guarding. Pain abdomen just above anterior suprerior iliac spine. No rlq pain.  Rt hip- no pain on rom.  Back- no cva tenderness.        Assessment & Plan:  Your pain is better but still present to some degree(but much less). Will go ahead and refer to urologist due to severe significant hematuria described on last exam.   No blood seen today on ua. Can continue with cipro and pain medication.  If any ruq pain let me now in that event need to get and refer to GI MD.  If pain rlq then may need to repeat ct abd/pelvis with contrast to recheck appendix. But other day normal appendix on renal stone protocol Ct.  Follow up her to be determined but call update in 4 days.  If any severe recurrent pain then ED evaluation.  25 minutes spent with pt. 50% of time spent counseling pt on plan going  forward.  [place], PA-C

## 2019-02-17 NOTE — Patient Instructions (Addendum)
Your pain is better but still present to some degree(but much less). Will go ahead and refer to urologist due to severe significant hematuria described on last exam.   No blood seen today on ua. Can continue with cipro and pain medication.  If any ruq pain let me now in that event need to get [place] and refer to GI MD.  If pain rlq then may need to repeat ct abd/pelvis with contrast to recheck appendix. But other day normal appendix on renal stone protocol Ct.  Follow up her to be determined but call update in 4 days.  If any severe recurrent pain then ED evaluation.

## 2019-02-21 DIAGNOSIS — R310 Gross hematuria: Secondary | ICD-10-CM | POA: Diagnosis not present

## 2019-08-06 DIAGNOSIS — H0011 Chalazion right upper eyelid: Secondary | ICD-10-CM | POA: Diagnosis not present

## 2019-08-13 DIAGNOSIS — H0011 Chalazion right upper eyelid: Secondary | ICD-10-CM | POA: Diagnosis not present

## 2020-11-24 DIAGNOSIS — M5412 Radiculopathy, cervical region: Secondary | ICD-10-CM | POA: Diagnosis not present

## 2020-11-24 DIAGNOSIS — S134XXA Sprain of ligaments of cervical spine, initial encounter: Secondary | ICD-10-CM | POA: Diagnosis not present

## 2020-11-24 DIAGNOSIS — S63511A Sprain of carpal joint of right wrist, initial encounter: Secondary | ICD-10-CM | POA: Diagnosis not present

## 2020-11-24 DIAGNOSIS — M9901 Segmental and somatic dysfunction of cervical region: Secondary | ICD-10-CM | POA: Diagnosis not present

## 2020-11-29 DIAGNOSIS — M9901 Segmental and somatic dysfunction of cervical region: Secondary | ICD-10-CM | POA: Diagnosis not present

## 2020-11-29 DIAGNOSIS — S134XXA Sprain of ligaments of cervical spine, initial encounter: Secondary | ICD-10-CM | POA: Diagnosis not present

## 2020-11-29 DIAGNOSIS — S63511A Sprain of carpal joint of right wrist, initial encounter: Secondary | ICD-10-CM | POA: Diagnosis not present

## 2020-11-29 DIAGNOSIS — M5412 Radiculopathy, cervical region: Secondary | ICD-10-CM | POA: Diagnosis not present

## 2020-12-02 DIAGNOSIS — S63511A Sprain of carpal joint of right wrist, initial encounter: Secondary | ICD-10-CM | POA: Diagnosis not present

## 2020-12-02 DIAGNOSIS — M5412 Radiculopathy, cervical region: Secondary | ICD-10-CM | POA: Diagnosis not present

## 2020-12-02 DIAGNOSIS — M9901 Segmental and somatic dysfunction of cervical region: Secondary | ICD-10-CM | POA: Diagnosis not present

## 2020-12-02 DIAGNOSIS — S134XXA Sprain of ligaments of cervical spine, initial encounter: Secondary | ICD-10-CM | POA: Diagnosis not present

## 2021-10-18 ENCOUNTER — Ambulatory Visit: Payer: No Typology Code available for payment source | Admitting: Medical

## 2021-10-20 ENCOUNTER — Other Ambulatory Visit: Payer: Self-pay

## 2021-10-20 ENCOUNTER — Emergency Department (HOSPITAL_BASED_OUTPATIENT_CLINIC_OR_DEPARTMENT_OTHER): Payer: BC Managed Care – PPO

## 2021-10-20 ENCOUNTER — Encounter (HOSPITAL_BASED_OUTPATIENT_CLINIC_OR_DEPARTMENT_OTHER): Payer: Self-pay | Admitting: Urology

## 2021-10-20 ENCOUNTER — Emergency Department (HOSPITAL_BASED_OUTPATIENT_CLINIC_OR_DEPARTMENT_OTHER)
Admission: EM | Admit: 2021-10-20 | Discharge: 2021-10-20 | Disposition: A | Discharge status: Home / Self Care | Payer: BC Managed Care – PPO | Attending: Emergency Medicine | Admitting: Emergency Medicine

## 2021-10-20 DIAGNOSIS — R202 Paresthesia of skin: Secondary | ICD-10-CM | POA: Diagnosis not present

## 2021-10-20 DIAGNOSIS — I10 Essential (primary) hypertension: Secondary | ICD-10-CM | POA: Diagnosis not present

## 2021-10-20 DIAGNOSIS — R079 Chest pain, unspecified: Secondary | ICD-10-CM | POA: Insufficient documentation

## 2021-10-20 DIAGNOSIS — R030 Elevated blood-pressure reading, without diagnosis of hypertension: Secondary | ICD-10-CM

## 2021-10-20 DIAGNOSIS — R0789 Other chest pain: Secondary | ICD-10-CM | POA: Diagnosis not present

## 2021-10-20 LAB — BASIC METABOLIC PANEL WITH GFR
Anion gap: 5 (ref 5–15)
BUN: 21 mg/dL — ABNORMAL HIGH (ref 6–20)
CO2: 27 mmol/L (ref 22–32)
Calcium: 9.2 mg/dL (ref 8.9–10.3)
Chloride: 105 mmol/L (ref 98–111)
Creatinine, Ser: 1.06 mg/dL (ref 0.61–1.24)
GFR, Estimated: >60 mL/min
Glucose, Bld: 112 mg/dL — ABNORMAL HIGH (ref 70–99)
Potassium: 4 mmol/L (ref 3.5–5.1)
Sodium: 137 mmol/L (ref 135–145)

## 2021-10-20 LAB — CBC
HCT: 45.7 % (ref 39.0–52.0)
Hemoglobin: 15.7 g/dL (ref 13.0–17.0)
MCH: 30.5 pg (ref 26.0–34.0)
MCHC: 34.4 g/dL (ref 30.0–36.0)
MCV: 88.7 fL (ref 80.0–100.0)
Platelets: 152 10*3/uL (ref 150–400)
RBC: 5.15 MIL/uL (ref 4.22–5.81)
RDW: 13.1 % (ref 11.5–15.5)
WBC: 6.9 10*3/uL (ref 4.0–10.5)
nRBC: 0 % (ref 0.0–0.2)

## 2021-10-20 LAB — TROPONIN I (HIGH SENSITIVITY): Troponin I (High Sensitivity): <2 ng/L (ref <18)

## 2021-10-20 MED ORDER — HYDROXYZINE HCL 25 MG PO TABS: 25.0000 mg | ORAL_TABLET | Freq: Four times a day (QID) | ORAL | 0 refills | Status: AC

## 2021-10-20 NOTE — ED Triage Notes (Signed)
Left sided chest pain x 2 weeks  States worsening over past two days and now having tingling and numbness in left arm  Denies SOB No cardiac hx

## 2021-10-20 NOTE — ED Provider Notes (Incomplete)
[facility] EMERGENCY DEPARTMENT Provider Note   CSN: [id number] Arrival date & time: 10/20/21  1645     History {Add pertinent medical, surgical, social history, OB history to HPI:1} Chief Complaint  Patient presents with   Chest Pain    David Bell is a 41 y.o. male.   Chest Pain  Patient is a 40 year old male presented emergency room today with complaints of left-sided chest pain seems to been ongoing intermittently for the past 2 weeks.  He has no symptoms currently.  He denies any medical problems but states that he has not seen a PCP in several years.  Denies any nausea vomiting shortness of breath no lightheadedness or dizziness.  He states that he occasionally has some numbness and tingling in his left arm but states he has good sensation and dexterity.  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  He does not smoke he denies any history of hypertension hyperlipidemia diabetes or high cholesterol.  He also denies any known first-degree relative who has had a heart attack over the age of 61.       Home Medications Prior to Admission medications   Medication Sig Start Date End Date Taking? Authorizing Provider  ciprofloxacin (CIPRO) 500 MG tablet Take 1 tablet (500 mg total) by mouth 2 (two) times daily. 02/14/19   Saguier, Ramon Dredge, PA-C  HYDROcodone-acetaminophen (NORCO) 5-325 MG tablet Take 1 tablet by mouth every 6 (six) hours as needed for moderate pain. 02/14/19   Saguier, Ramon Dredge, PA-C  venlafaxine XR (EFFEXOR-XR) 37.5 MG 24 hr capsule Take 1 capsule (37.5 mg total) by mouth daily with breakfast. 10/25/18   Saguier, Ramon Dredge, PA-C      Allergies    Patient has no known allergies.    Review of Systems   Review of Systems  Cardiovascular:  Positive for chest pain.    Physical Exam Updated Vital Signs BP (!) 147/99   Pulse 62   Temp 97.6 F (36.4 C) (Oral)   Resp 14   Ht 5\' 10"  (1.778 m)   Wt 100.4 kg   SpO2 96%   BMI  31.76 kg/m  Physical Exam Vitals and nursing note reviewed.  Constitutional:      General: He is not in acute distress. HENT:     Head: Normocephalic and atraumatic.     Nose: Nose normal.  Eyes:     General: No scleral icterus. Cardiovascular:     Rate and Rhythm: Normal rate and regular rhythm.     Pulses: Normal pulses.     Heart sounds: Normal heart sounds.     Comments: Bilateral radial artery pulses 2+ and symmetric. Pulmonary:     Effort: Pulmonary effort is normal. No respiratory distress.     Breath sounds: No wheezing.  Abdominal:     Palpations: Abdomen is soft.     Tenderness: There is no abdominal tenderness.  Musculoskeletal:     Cervical back: Normal range of motion.     Right lower leg: No edema.     Left lower leg: No edema.  Skin:    General: Skin is warm and dry.     Capillary Refill: Capillary refill takes less than 2 seconds.  Neurological:     Mental Status: He is alert. Mental status is at baseline.     Comments: Sensation intact in bilateral lower extremity and upper extremities   Psychiatric:        Mood and Affect: Mood normal.  Behavior: Behavior normal.     ED Results / Procedures / Treatments   Labs (all labs ordered are listed, but only abnormal results are displayed) Labs Reviewed  BASIC METABOLIC PANEL - Abnormal; Notable for the following components:      Result Value   Glucose, Bld 112 (*)    BUN 21 (*)    All other components within normal limits  CBC  TROPONIN I (HIGH SENSITIVITY)    EKG EKG Interpretation  Date/Time:  Thursday October 20 2021 16:55:32 EDT Ventricular Rate:  64 PR Interval:  134 QRS Duration: 88 QT Interval:  396 QTC Calculation: 408 R Axis:   66 Text Interpretation: Normal sinus rhythm Normal ECG No previous ECGs available No old tracing to compare Confirmed by Tanda Rockers ([id number]) on 10/20/2021 7:50:58 PM  Radiology DG Chest 2 View  Result Date: 10/20/2021 CLINICAL DATA:  Chest pain. EXAM: CHEST -  2 VIEW COMPARISON:  None Available. FINDINGS: The heart size and mediastinal contours are within normal limits. Both lungs are clear. The visualized skeletal structures are unremarkable. IMPRESSION: No active cardiopulmonary disease. Electronically Signed   By: Darliss Cheney M.D.   On: 10/20/2021 17:18    Procedures Procedures  {Document cardiac monitor, telemetry assessment procedure when appropriate:1}  Medications Ordered in ED Medications - No data to display  ED Course/ Medical Decision Making/ A&P Clinical Course as of 10/20/21 2029  Thu Oct 20, 2021  1955 CP for 2 weeks.  50-60% of the day he has some pain.  No SOB N or Vomiting.  No leg pain or swelling  No recent surgeries, hospitalization, long travel, hemoptysis, estrogen containing OCP, cancer history.  No unilateral leg swelling.  No history of PE or VTE.  [WF]    Clinical Course User Index [WF] Gailen Shelter, [place]                           Medical Decision Making Amount and/or Complexity of Data Reviewed Labs: ordered. Radiology: ordered.  Risk Prescription drug management.   This patient presents to the ED for concern of CP, this involves a number of treatment options, and is a complaint that carries with it a moderate-high risk of complications and morbidity.  The differential diagnosis includes The emergent causes of chest pain include: Acute coronary syndrome, tamponade, pericarditis/myocarditis, aortic dissection, pulmonary embolism, tension pneumothorax, pneumonia, and esophageal rupture.   I do not believe the patient has an emergent cause of chest pain, other urgent/non-acute considerations include, but are not limited to: chronic angina, aortic stenosis, cardiomyopathy, mitral valve prolapse, pulmonary hypertension, aortic insufficiency, right ventricular hypertrophy, pleuritis, bronchitis, pneumothorax, tumor, gastroesophageal reflux disease (GERD), esophageal spasm, Mallory-Weiss syndrome, peptic ulcer  disease, pancreatitis, functional gastrointestinal pain, cervical or thoracic disk disease or arthritis, shoulder arthritis, costochondritis, subacromial bursitis, anxiety or panic attack, herpes zoster, breast disorders, chest wall tumors, thoracic outlet syndrome, mediastinitis.    Co morbidities: Discussed in HPI   Brief History:  Patient is a 41 year old male presented emergency room today with complaints of left-sided chest pain seems to been ongoing intermittently for the past 2 weeks.  He has no symptoms currently.  He denies any medical problems but states that he has not seen a PCP in several years.  Denies any nausea vomiting shortness of breath no lightheadedness or dizziness.  He states that he occasionally has some numbness and tingling in his left arm but states he has good sensation and dexterity.  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  He does not smoke he denies any history of hypertension hyperlipidemia diabetes or high cholesterol.  He also denies any known first-degree relative who has had a heart attack over the age of 49.    EMR reviewed including pt PMHx, past surgical history and past visits to ER.   See HPI for more details   Lab Tests:   {Blank single:19197::"I ordered and independently interpreted labs. Labs notable for","I personally reviewed all laboratory work and imaging. Metabolic panel without any acute abnormality specifically kidney function within normal limits and no significant electrolyte abnormalities. CBC without leukocytosis or significant anemia."}   Imaging Studies:  {Blank single:19197::"NAD. I personally reviewed all imaging studies and no acute abnormality found. I agree with radiology interpretation.","Abnormal findings. I personally reviewed all imaging studies. Imaging notable for","No imaging studies ordered for this patient"}    Cardiac Monitoring:  {Blank single:19197::"The patient was maintained on a  cardiac monitor.  I personally viewed and interpreted the cardiac monitored which showed an underlying rhythm of:","NA"} {Blank single:19197::"EKG non-ischemic","NA"}   Medicines ordered:  I ordered medication including ***  for *** Reevaluation of the patient after these medicines showed that the patient {resolved/improved/worsened:23923::"improved"} I have reviewed the patients home medicines and have made adjustments as needed   Critical Interventions:  ***   Consults/Attending Physician   {Blank single:19197::"I requested consultation with ***,  and discussed lab and imaging findings as well as pertinent plan - they recommend: ***","I discussed this case with my attending physician who cosigned this note including patient's presenting symptoms, physical exam, and planned diagnostics and interventions. Attending physician stated agreement with plan or made changes to plan which were implemented."}   Reevaluation:  After the interventions noted above I re-evaluated patient and found that they have :{resolved/improved/worsened:23923::"improved"}   Social Determinants of Health:  {Blank single:19197::"Given cab voucher","Social work/case management involved","The patient's social determinants of health were a factor in the care of this patient"}    Problem List / ED Course:  ***   Dispostion:  After consideration of the diagnostic results and the patients response to treatment, I feel that the patent would benefit from ***       Final Clinical Impression(s) / ED Diagnoses Final diagnoses:  Nonspecific chest pain  Elevated blood pressure reading    Rx / DC Orders ED Discharge Orders     None

## 2021-10-20 NOTE — Discharge Instructions (Addendum)
I am reassured that you are no longer having any chest pain.  I do think that following up with your primary care doctor is a reasonable option.  If you have any new or concerning symptoms please return to the emergency room.  I have prescribed you a medicine called hydroxyzine to take.

## 2022-10-10 ENCOUNTER — Telehealth (INDEPENDENT_AMBULATORY_CARE_PROVIDER_SITE_OTHER): Payer: Self-pay | Admitting: Primary Care

## 2022-10-10 NOTE — Telephone Encounter (Signed)
Left VM with Pt 

## 2022-10-11 ENCOUNTER — Ambulatory Visit (INDEPENDENT_AMBULATORY_CARE_PROVIDER_SITE_OTHER): Payer: No Typology Code available for payment source | Admitting: Primary Care

## 2023-09-27 IMAGING — CR DG CHEST 2V
2 series · 2 of 2 positions shown · non-contrast
Comparison: None Available.

CLINICAL DATA: Chest pain.

EXAM:
CHEST - 2 VIEW

[w chest pa]
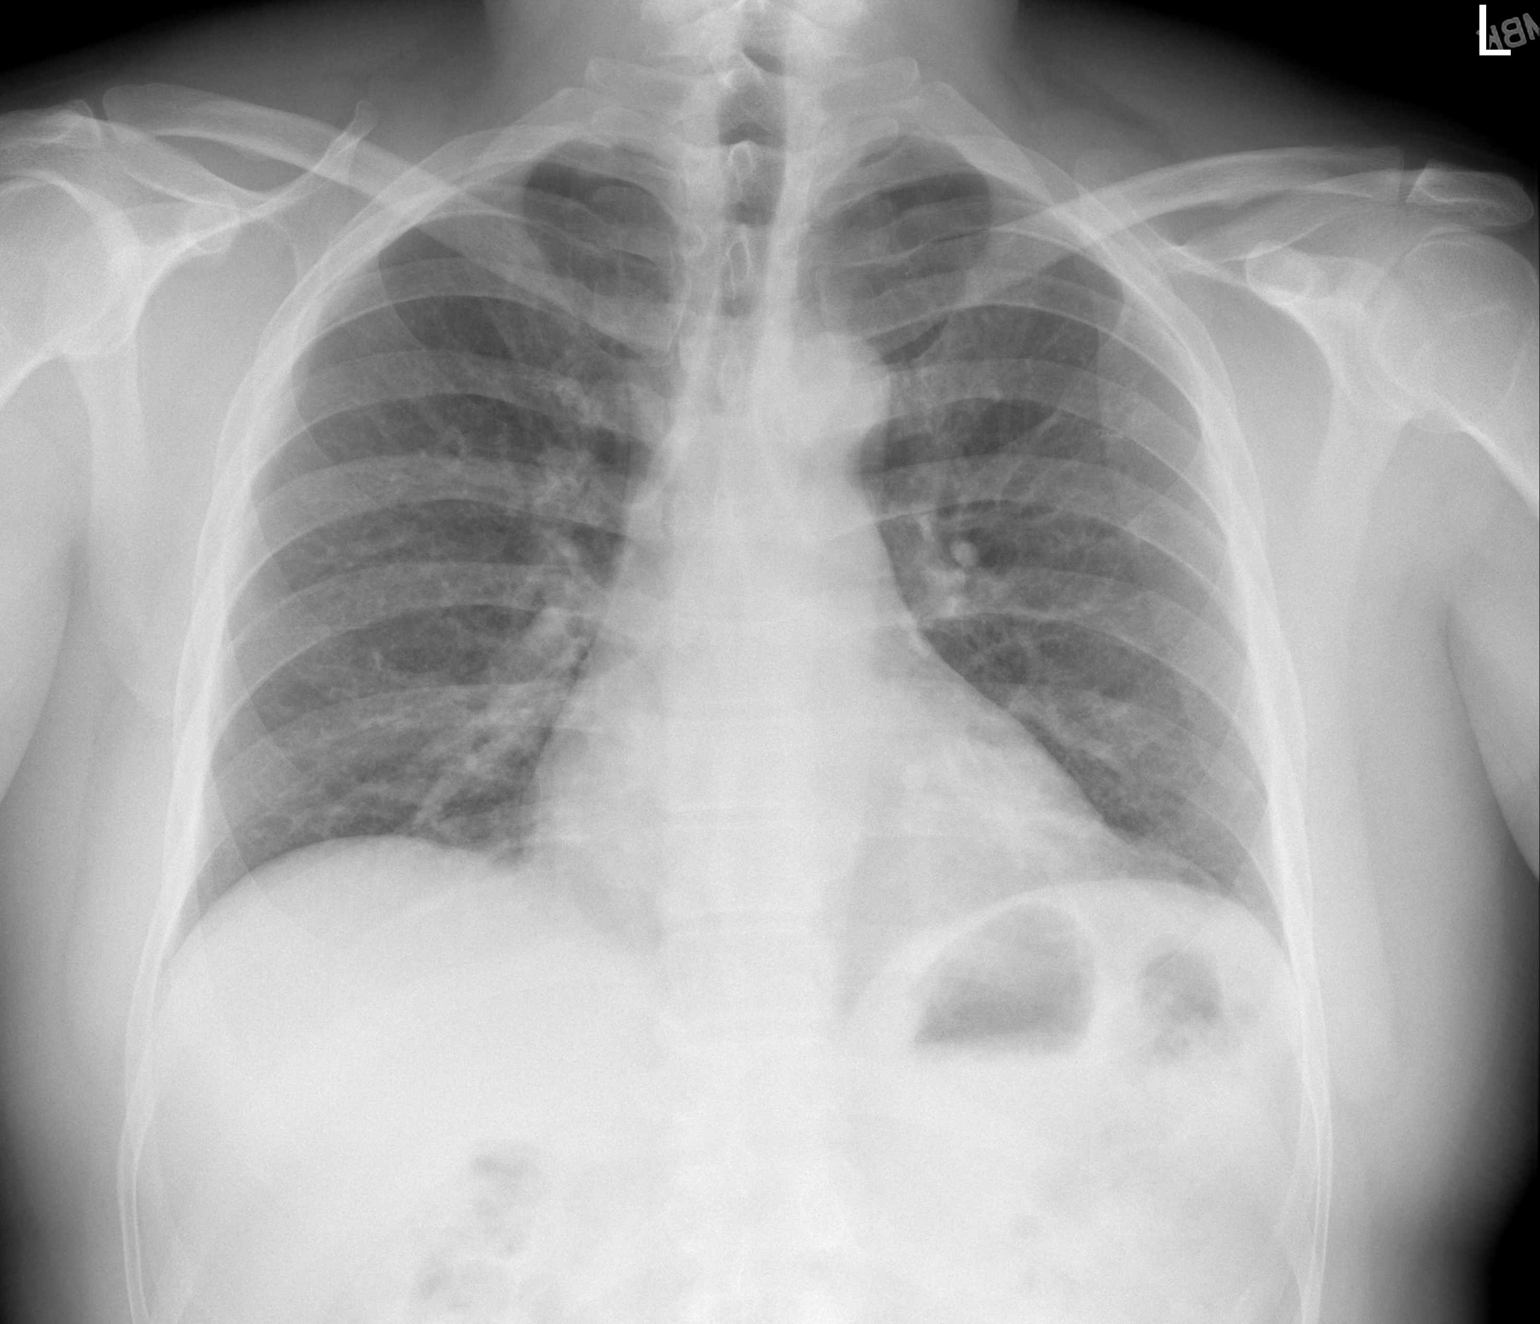

[w chest lat]
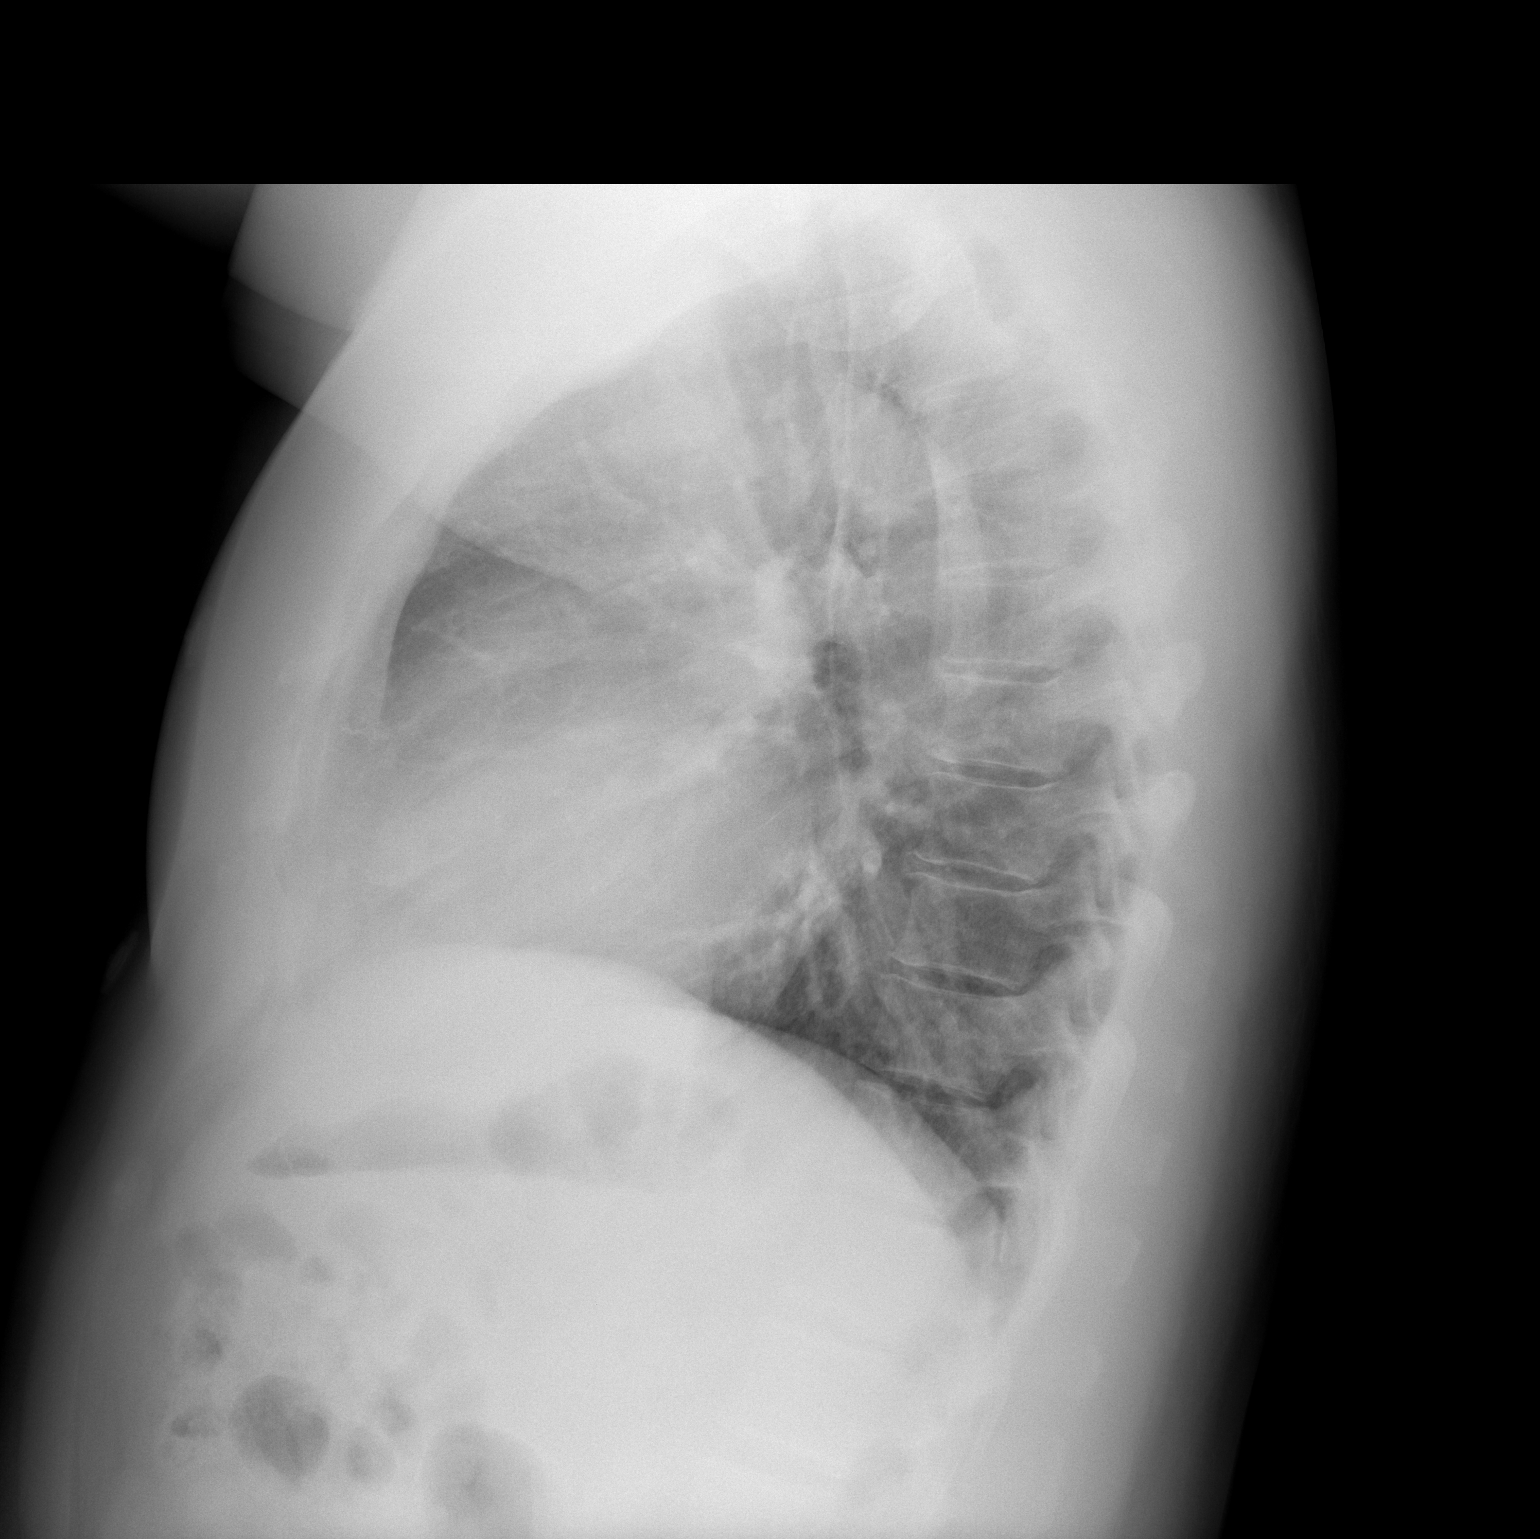

[2 of 2 positions shown; findings below may reference images not displayed]

FINDINGS: The heart size and mediastinal contours are within normal limits.
Both lungs are clear. The visualized skeletal structures are
unremarkable.
IMPRESSION: No active cardiopulmonary disease.
